# Patient Record
Sex: Male | Born: 1950 | Race: White | Hispanic: No | Marital: Single | Smoking: Former smoker
Health system: Southern US, Community
[De-identification: ages and names within clinical notes are randomized; demographics above are authoritative.]

## PROBLEM LIST (undated history)

## (undated) DIAGNOSIS — E785 Hyperlipidemia, unspecified: Secondary | ICD-10-CM

## (undated) DIAGNOSIS — I10 Essential (primary) hypertension: Secondary | ICD-10-CM

## (undated) HISTORY — DX: Hyperlipidemia, unspecified: E78.5

## (undated) HISTORY — PX: EYE SURGERY: SHX253

---

## 2017-10-30 ENCOUNTER — Ambulatory Visit (HOSPITAL_COMMUNITY)
Admission: EM | Admit: 2017-10-30 | Discharge: 2017-10-30 | Discharge status: Against Medical Advice | Payer: Medicare PPO | Source: Home / Self Care

## 2017-10-30 ENCOUNTER — Emergency Department (HOSPITAL_COMMUNITY): Payer: Medicare PPO

## 2017-10-30 ENCOUNTER — Encounter (HOSPITAL_COMMUNITY): Payer: Self-pay | Admitting: Emergency Medicine

## 2017-10-30 ENCOUNTER — Emergency Department (HOSPITAL_COMMUNITY)
Admission: EM | Admit: 2017-10-30 | Discharge: 2017-10-30 | Disposition: A | Discharge status: Home / Self Care | Payer: Medicare PPO | Attending: Emergency Medicine | Admitting: Emergency Medicine

## 2017-10-30 DIAGNOSIS — Y998 Other external cause status: Secondary | ICD-10-CM | POA: Diagnosis not present

## 2017-10-30 DIAGNOSIS — S060X0A Concussion without loss of consciousness, initial encounter: Secondary | ICD-10-CM | POA: Diagnosis not present

## 2017-10-30 DIAGNOSIS — Y9389 Activity, other specified: Secondary | ICD-10-CM | POA: Insufficient documentation

## 2017-10-30 DIAGNOSIS — R42 Dizziness and giddiness: Secondary | ICD-10-CM | POA: Diagnosis present

## 2017-10-30 DIAGNOSIS — I10 Essential (primary) hypertension: Secondary | ICD-10-CM | POA: Insufficient documentation

## 2017-10-30 DIAGNOSIS — Y9289 Other specified places as the place of occurrence of the external cause: Secondary | ICD-10-CM | POA: Diagnosis not present

## 2017-10-30 DIAGNOSIS — Z87891 Personal history of nicotine dependence: Secondary | ICD-10-CM | POA: Diagnosis not present

## 2017-10-30 DIAGNOSIS — W01198A Fall on same level from slipping, tripping and stumbling with subsequent striking against other object, initial encounter: Secondary | ICD-10-CM | POA: Insufficient documentation

## 2017-10-30 HISTORY — DX: Essential (primary) hypertension: I10

## 2017-10-30 LAB — COMPREHENSIVE METABOLIC PANEL
ALBUMIN: 3.3 g/dL — AB (ref 3.5–5.0)
ALT: 34 U/L (ref 17–63)
ANION GAP: 10 (ref 5–15)
AST: 39 U/L (ref 15–41)
Alkaline Phosphatase: 72 U/L (ref 38–126)
BUN: 9 mg/dL (ref 6–20)
CHLORIDE: 103 mmol/L (ref 101–111)
CO2: 25 mmol/L (ref 22–32)
Calcium: 9.2 mg/dL (ref 8.9–10.3)
Creatinine, Ser: 0.88 mg/dL (ref 0.61–1.24)
GFR calc non Af Amer: >60 mL/min (ref >60)
GLUCOSE: 106 mg/dL — AB (ref 65–99)
Potassium: 4.2 mmol/L (ref 3.5–5.1)
SODIUM: 138 mmol/L (ref 135–145)
Total Bilirubin: 0.6 mg/dL (ref 0.3–1.2)
Total Protein: 6.4 g/dL — ABNORMAL LOW (ref 6.5–8.1)

## 2017-10-30 LAB — I-STAT CHEM 8, ED
BUN: 11 mg/dL (ref 6–20)
CHLORIDE: 102 mmol/L (ref 101–111)
CREATININE: 0.9 mg/dL (ref 0.61–1.24)
Calcium, Ion: 1.25 mmol/L (ref 1.15–1.40)
Glucose, Bld: 100 mg/dL — ABNORMAL HIGH (ref 65–99)
HEMATOCRIT: 42 % (ref 39.0–52.0)
Hemoglobin: 14.3 g/dL (ref 13.0–17.0)
POTASSIUM: 4.2 mmol/L (ref 3.5–5.1)
Sodium: 139 mmol/L (ref 135–145)
TCO2: 25 mmol/L (ref 22–32)

## 2017-10-30 LAB — CBC
HCT: 42 % (ref 39.0–52.0)
Hemoglobin: 14 g/dL (ref 13.0–17.0)
MCH: 32.7 pg (ref 26.0–34.0)
MCHC: 33.3 g/dL (ref 30.0–36.0)
MCV: 98.1 fL (ref 78.0–100.0)
PLATELETS: 265 10*3/uL (ref 150–400)
RBC: 4.28 MIL/uL (ref 4.22–5.81)
RDW: 13.1 % (ref 11.5–15.5)
WBC: 6.3 10*3/uL (ref 4.0–10.5)

## 2017-10-30 LAB — DIFFERENTIAL
BASOS PCT: 0 %
Basophils Absolute: 0 10*3/uL (ref 0.0–0.1)
EOS PCT: 1 %
Eosinophils Absolute: 0.1 10*3/uL (ref 0.0–0.7)
Lymphocytes Relative: 23 %
Lymphs Abs: 1.5 10*3/uL (ref 0.7–4.0)
MONO ABS: 0.4 10*3/uL (ref 0.1–1.0)
Monocytes Relative: 7 %
NEUTROS PCT: 69 %
Neutro Abs: 4.4 10*3/uL (ref 1.7–7.7)

## 2017-10-30 MED ORDER — MECLIZINE HCL 25 MG PO TABS: 25.0000 mg | ORAL_TABLET | Freq: Three times a day (TID) | ORAL | 0 refills | Status: DC | PRN

## 2017-10-30 NOTE — ED Provider Notes (Signed)
[facility] EMERGENCY DEPARTMENT Provider Note   CSN: [id number] Arrival date & time: 10/30/17  1523     History   Chief Complaint Chief Complaint  Patient presents with  . Dizziness    HPI Mason Collins is a 67 y.o. male.  Presents evaluation with a chief complaint of dizziness and a recent fall.  HPI otherwise healthy 67 year old male with a history only of hypertension.  He moved himself in a moving [employer] from [place], to [place] this week.  He was working Wednesday about unloading his truck.  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  He states he was tired, had been working hard, not eaten much.  He states he had an episode where he felt a little confused Wednesday night.  Thursday he felt well.  He was hoping unload his truck.  He lost his balance and fell backward and struck his occiput on the ramp of his moving van.  He has a mild headache since that time.  He was concerned because he has had to need although improving dizziness.  He described this as the room spinning around him.  He had experienced a somewhat over the last few days before his fall and attributed to some nasal congestion and cold symptoms.   Past Medical History:  Diagnosis Date  . Hypertension     There are no active problems to display for this patient.   Past Surgical History:  Procedure Laterality Date  . EYE SURGERY         Home Medications    Prior to Admission medications   Medication Sig Start Date End Date Taking? Authorizing Provider  meclizine (ANTIVERT) 25 MG tablet Take 1 tablet (25 mg total) by mouth 3 (three) times daily as needed for dizziness. 10/30/17   Rolland Porter, MD    Family History History reviewed. No pertinent family history.  Social History Social History   Tobacco Use  . Smoking status: Former Games developer  . Smokeless tobacco: Never Used  Substance Use Topics  . Alcohol use: No    Frequency: Never  . Drug use: No     Allergies   Bactrim  [sulfamethoxazole-trimethoprim] and Penicillins   Review of Systems Review of Systems  Constitutional: Negative for appetite change, chills, diaphoresis, fatigue and fever.  HENT: Positive for congestion. Negative for mouth sores, sore throat and trouble swallowing.   Eyes: Negative for visual disturbance.  Respiratory: Negative for cough, chest tightness, shortness of breath and wheezing.   Cardiovascular: Negative for chest pain.  Gastrointestinal: Negative for abdominal distention, abdominal pain, diarrhea, nausea and vomiting.  Endocrine: Negative for polydipsia, polyphagia and polyuria.  Genitourinary: Negative for dysuria, frequency and hematuria.  Musculoskeletal: Negative for gait problem.  Skin: Negative for color change, pallor and rash.  Neurological: Positive for dizziness. Negative for syncope, light-headedness and headaches.  Hematological: Does not bruise/bleed easily.  Psychiatric/Behavioral: Negative for behavioral problems and confusion.     Physical Exam Updated Vital Signs BP (!) 153/89   Pulse 61   Temp 98.9 F (37.2 C) (Oral)   Resp 14   Ht 5\' 10"  (1.778 m)   Wt 77.1 kg (170 lb)   SpO2 100%   BMI 24.39 kg/m   Physical Exam  Constitutional: He is oriented to person, place, and time. He appears well-developed and well-nourished. No distress.  HENT:  Head: Normocephalic.  No sign of trauma to the head.  No contusion or tenderness over the scalp.  Nontender the midline neck  and spine.  Eyes: Conjunctivae are normal. Pupils are equal, round, and reactive to light. No scleral icterus.  Neck: Normal range of motion. Neck supple. No thyromegaly present.  Cardiovascular: Normal rate and regular rhythm. Exam reveals no gallop and no friction rub.  No murmur heard. Pulmonary/Chest: Effort normal and breath sounds normal. No respiratory distress. He has no wheezes. He has no rales.  Abdominal: Soft. Bowel sounds are normal. He exhibits no distension. There is no  tenderness. There is no rebound.  Musculoskeletal: Normal range of motion.  Neurological: He is alert and oriented to person, place, and time.  Normal cranial nerve exam.  He has minimal nystagmus to lateral gaze.  Otherwise intact to symmetric cranial nerves gait and peripheral sensation and strength.  Skin: Skin is warm and dry. No rash noted.  Psychiatric: He has a normal mood and affect. His behavior is normal.     ED Treatments / Results  Labs (all labs ordered are listed, but only abnormal results are displayed) Labs Reviewed  COMPREHENSIVE METABOLIC PANEL - Abnormal; Notable for the following components:      Result Value   Glucose, Bld 106 (*)    Total Protein 6.4 (*)    Albumin 3.3 (*)    All other components within normal limits  I-STAT CHEM 8, ED - Abnormal; Notable for the following components:   Glucose, Bld 100 (*)    All other components within normal limits  CBC  DIFFERENTIAL    EKG  EKG Interpretation None       Radiology Ct Head Wo Contrast  Result Date: 10/30/2017 CLINICAL DATA:  Status post fall 3 days ago. Hit head against asphalt fall. No loss of consciousness. Dizziness with headaches. EXAM: CT HEAD WITHOUT CONTRAST TECHNIQUE: Contiguous axial images were obtained from the base of the skull through the vertex without intravenous contrast. COMPARISON:  None. FINDINGS: Brain: No evidence of acute infarction, hemorrhage, extra-axial collection, ventriculomegaly, or mass effect. Generalized cerebral atrophy. Periventricular white matter low attenuation likely secondary to microangiopathy. Vascular: Cerebrovascular atherosclerotic calcifications are noted. Skull: Negative for fracture or focal lesion. Sinuses/Orbits: Visualized portions of the orbits are unremarkable. Visualized portions of the paranasal sinuses and mastoid air cells are unremarkable. Other: None. IMPRESSION: 1. No acute intracranial pathology. 2. Chronic microvascular disease and cerebral  atrophy. Electronically Signed   By: Elige KoHetal  Patel   On: 10/30/2017 16:30    Procedures Procedures (including critical care time)  Medications Ordered in ED Medications - No data to display   Initial Impression / Assessment and Plan / ED Course  I have reviewed the triage vital signs and the nursing notes.  Pertinent labs & imaging results that were available during my care of the patient were reviewed by me and considered in my medical decision making (see chart for details).    Vertigo.  Perhaps combination of acute peripheral versus concussive.  No additional close head injury symptoms.  I think is appropriate for outpatient treatment.  PRN meclizine.  Driving precautions.  Recheck any worsening.  Final Clinical Impressions(s) / ED Diagnoses   Final diagnoses:  Concussion without loss of consciousness, initial encounter    ED Discharge Orders        Ordered    meclizine (ANTIVERT) 25 MG tablet  3 times daily PRN     10/30/17 1854       Rolland PorterJames, Lauree Yurick, MD 10/30/17 2356

## 2017-10-30 NOTE — ED Notes (Signed)
Per pt, was unloading truck when he fell backwards on ramp, falling and hit head on asphalt 2 days ago.  No LOC.  Has had some dizziness, and has to "get my bearings when I stand up".  No blood thinner use.  Family member states that he moved here from [place] 3 days ago; prior to fall, he had "an episode" of driving, but having no recollection of how he got to where he was, and no recollection of family member picking him up.  Discussed with Mannie StabileM. Clark, PA and Arthor CaptainA. Yu, PA - both concur pt needs to be evaluated in ED for CT scan.  Pt & family member verbalized understanding.

## 2017-10-30 NOTE — ED Triage Notes (Addendum)
Patient presents to ED for assessment after a fall on Thursday.  Patient states he has been dealing with dizziness "for a bit", but since the fall, he struck the back left of his head, and he has been having worsening dizziness.  Patient denies chest pain, SOB.  States he went on a long car trip for 2 days about a week ago.  Patient c/o a head fullness recently with nasal congestion.  No neuro deficits noted in triage.  Patient states he also cannot remember the happenings of Wednesday night, and is concerned about the memory lapse.

## 2017-10-30 NOTE — ED Notes (Signed)
Pt and wife dressed standing in doorway of room

## 2017-10-30 NOTE — ED Notes (Signed)
Pt stable, ambulatory, states understanding of discharge instructions 

## 2017-10-30 NOTE — Discharge Instructions (Signed)
Meclizine as needed for dizziness

## 2017-11-09 ENCOUNTER — Telehealth: Payer: Self-pay | Admitting: Family Medicine

## 2017-11-09 NOTE — Telephone Encounter (Signed)
Copied from CRM [id number]. Topic: Appointment Scheduling - New Patient >> Nov 09, 2017  2:46 PM Waymon AmatoBurton, Donna F wrote: Pt ex wife is wanitng to get pt established with Dr. Salomon FickBanks - he is new in town and and while moving he hit his head and has a concussion 2 weeks ago   Best number [phone] ex wife

## 2017-11-16 NOTE — Telephone Encounter (Signed)
Is pt unable to call himself about scheduling an appt?

## 2017-11-16 NOTE — Telephone Encounter (Signed)
Called and spoke with wife. She verbalized that she will have him call the office and inquire if we can accept him as a  New pt.

## 2018-06-15 ENCOUNTER — Encounter: Payer: Self-pay | Admitting: Family Medicine

## 2018-06-15 ENCOUNTER — Ambulatory Visit: Payer: Medicare PPO | Admitting: Family Medicine

## 2018-06-15 VITALS — BP 128/82 | HR 100 | Temp 97.9°F | Ht 70.0 in | Wt 186.0 lb

## 2018-06-15 DIAGNOSIS — Z23 Encounter for immunization: Secondary | ICD-10-CM | POA: Diagnosis not present

## 2018-06-15 DIAGNOSIS — E785 Hyperlipidemia, unspecified: Secondary | ICD-10-CM | POA: Diagnosis not present

## 2018-06-15 DIAGNOSIS — R42 Dizziness and giddiness: Secondary | ICD-10-CM | POA: Diagnosis not present

## 2018-06-15 DIAGNOSIS — J302 Other seasonal allergic rhinitis: Secondary | ICD-10-CM

## 2018-06-15 DIAGNOSIS — I10 Essential (primary) hypertension: Secondary | ICD-10-CM

## 2018-06-15 DIAGNOSIS — Z7689 Persons encountering health services in other specified circumstances: Secondary | ICD-10-CM

## 2018-06-15 DIAGNOSIS — K219 Gastro-esophageal reflux disease without esophagitis: Secondary | ICD-10-CM | POA: Diagnosis not present

## 2018-06-15 MED ORDER — MECLIZINE HCL 25 MG PO TABS: 25.0000 mg | ORAL_TABLET | Freq: Three times a day (TID) | ORAL | 3 refills | Status: DC | PRN

## 2018-06-15 MED ORDER — LISINOPRIL 10 MG PO TABS: 10.0000 mg | ORAL_TABLET | Freq: Every day | ORAL | 3 refills | Status: AC

## 2018-06-15 MED ORDER — ESOMEPRAZOLE MAGNESIUM 40 MG PO CPDR: 40.0000 mg | DELAYED_RELEASE_CAPSULE | Freq: Every day | ORAL | 3 refills | Status: AC

## 2018-06-15 MED ORDER — FLUTICASONE PROPIONATE 50 MCG/ACT NA SUSP: 1.0000 | Freq: Every day | NASAL | 3 refills | Status: AC

## 2018-06-15 MED ORDER — ATORVASTATIN CALCIUM 20 MG PO TABS: 20.0000 mg | ORAL_TABLET | Freq: Every day | ORAL | 3 refills | Status: AC

## 2018-06-15 NOTE — Patient Instructions (Signed)
Food Choices for Gastroesophageal Reflux Disease, Adult When you have gastroesophageal reflux disease (GERD), the foods you eat and your eating habits are very important. Choosing the right foods can help ease the discomfort of GERD. Consider working with a diet and nutrition specialist (dietitian) to help you make healthy food choices. What general guidelines should I follow? Eating plan  Choose healthy foods low in fat, such as fruits, vegetables, whole grains, low-fat dairy products, and lean meat, fish, and poultry.  Eat frequent, small meals instead of three large meals each day. Eat your meals slowly, in a relaxed setting. Avoid bending over or lying down until 2-3 hours after eating.  Limit high-fat foods such as fatty meats or fried foods.  Limit your intake of oils, butter, and shortening to less than 8 teaspoons each day.  Avoid the following: ? Foods that cause symptoms. These may be different for different people. Keep a food diary to keep track of foods that cause symptoms. ? Alcohol. ? Drinking large amounts of liquid with meals. ? Eating meals during the 2-3 hours before bed.  Cook foods using methods other than frying. This may include baking, grilling, or broiling. Lifestyle   Maintain a healthy weight. Ask your health care provider what weight is healthy for you. If you need to lose weight, work with your health care provider to do so safely.  Exercise for at least 30 minutes on 5 or more days each week, or as told by your health care provider.  Avoid wearing clothes that fit tightly around your waist and chest.  Do not use any products that contain nicotine or tobacco, such as cigarettes and e-cigarettes. If you need help quitting, ask your health care provider.  Sleep with the head of your bed raised. Use a wedge under the mattress or blocks under the bed frame to raise the head of the bed. What foods are not recommended? The items listed may not be a complete  list. Talk with your dietitian about what dietary choices are best for you. Grains Pastries or quick breads with added fat. French toast. Vegetables Deep fried vegetables. French fries. Any vegetables prepared with added fat. Any vegetables that cause symptoms. For some people this may include tomatoes and tomato products, chili peppers, onions and garlic, and horseradish. Fruits Any fruits prepared with added fat. Any fruits that cause symptoms. For some people this may include citrus fruits, such as oranges, grapefruit, pineapple, and lemons. Meats and other protein foods High-fat meats, such as fatty beef or pork, hot dogs, ribs, ham, sausage, salami and bacon. Fried meat or protein, including fried fish and fried chicken. Nuts and nut butters. Dairy Whole milk and chocolate milk. Sour cream. Cream. Ice cream. Cream cheese. Milk shakes. Beverages Coffee and tea, with or without caffeine. Carbonated beverages. Sodas. Energy drinks. Fruit juice made with acidic fruits (such as [place] or grapefruit). Tomato juice. Alcoholic drinks. Fats and oils Butter. Margarine. Shortening. Ghee. Sweets and desserts Chocolate and cocoa. Donuts. Seasoning and other foods Pepper. Peppermint and spearmint. Any condiments, herbs, or seasonings that cause symptoms. For some people, this may include curry, hot sauce, or vinegar-based salad dressings. Summary  When you have gastroesophageal reflux disease (GERD), food and lifestyle choices are very important to help ease the discomfort of GERD.  Eat frequent, small meals instead of three large meals each day. Eat your meals slowly, in a relaxed setting. Avoid bending over or lying down until 2-3 hours after eating.  Limit high-fat   foods such as fatty meat or fried foods. This information is not intended to replace advice given to you by your health care provider. Make sure you discuss any questions you have with your health care provider. Document Released:  08/16/2005 Document Revised: 08/17/2016 Document Reviewed: 08/17/2016 Elsevier Interactive Patient Education  2018 [employer]  High Cholesterol High cholesterol is a condition in which the blood has high levels of a white, waxy, fat-like substance (cholesterol). The human body needs small amounts of cholesterol. The liver makes all the cholesterol that the body needs. Extra (excess) cholesterol comes from the food that we eat. Cholesterol is carried from the liver by the blood through the blood vessels. If you have high cholesterol, deposits (plaques) may build up on the walls of your blood vessels (arteries). Plaques make the arteries narrower and stiffer. Cholesterol plaques increase your risk for heart attack and stroke. Work with your health care provider to keep your cholesterol levels in a healthy range. What increases the risk? This condition is more likely to develop in people who:  Eat foods that are high in animal fat (saturated fat) or cholesterol.  Are overweight.  Are not getting enough exercise.  Have a family history of high cholesterol.  What are the signs or symptoms? There are no symptoms of this condition. How is this diagnosed? This condition may be diagnosed from the results of a blood test.  If you are older than age 91, your health care provider may check your cholesterol every 4-6 years.  You may be checked more often if you already have high cholesterol or other risk factors for heart disease.  The blood test for cholesterol measures:  "Bad" cholesterol (LDL cholesterol). This is the main type of cholesterol that causes heart disease. The desired level for LDL is less than 100.  "Good" cholesterol (HDL cholesterol). This type helps to protect against heart disease by cleaning the arteries and carrying the LDL away. The desired level for HDL is 60 or higher.  Triglycerides. These are fats that the body can store or burn for energy. The desired number for  triglycerides is lower than 150.  Total cholesterol. This is a measure of the total amount of cholesterol in your blood, including LDL cholesterol, HDL cholesterol, and triglycerides. A healthy number is less than 200.  How is this treated? This condition is treated with diet changes, lifestyle changes, and medicines. Diet changes  This may include eating more whole grains, fruits, vegetables, nuts, and fish.  This may also include cutting back on red meat and foods that have a lot of added sugar. Lifestyle changes  Changes may include getting at least 40 minutes of aerobic exercise 3 times a week. Aerobic exercises include walking, biking, and swimming. Aerobic exercise along with a healthy diet can help you maintain a healthy weight.  Changes may also include quitting smoking. Medicines  Medicines are usually given if diet and lifestyle changes have failed to reduce your cholesterol to healthy levels.  Your health care provider may prescribe a statin medicine. Statin medicines have been shown to reduce cholesterol, which can reduce the risk of heart disease. Follow these instructions at home: Eating and drinking  If told by your health care provider:  Eat chicken (without skin), fish, veal, shellfish, ground [place] breast, and round or loin cuts of red meat.  Do not eat fried foods or fatty meats, such as hot dogs and salami.  Eat plenty of fruits, such as apples.  Eat  plenty of vegetables, such as broccoli, potatoes, and carrots.  Eat beans, peas, and lentils.  Eat grains such as barley, rice, couscous, and bulgur wheat.  Eat pasta without cream sauces.  Use skim or nonfat milk, and eat low-fat or nonfat yogurt and cheeses.  Do not eat or drink whole milk, cream, ice cream, egg yolks, or hard cheeses.  Do not eat stick margarine or tub margarines that contain trans fats (also called partially hydrogenated oils).  Do not eat saturated tropical oils, such as coconut oil  and palm oil.  Do not eat cakes, cookies, crackers, or other baked goods that contain trans fats.  General instructions  Exercise as directed by your health care provider. Increase your activity level with activities such as gardening, walking, and taking the stairs.  Take over-the-counter and prescription medicines only as told by your health care provider.  Do not use any products that contain nicotine or tobacco, such as cigarettes and e-cigarettes. If you need help quitting, ask your health care provider.  Keep all follow-up visits as told by your health care provider. This is important. Contact a health care provider if:  You are struggling to maintain a healthy diet or weight.  You need help to start on an exercise program.  You need help to stop smoking. Get help right away if:  You have chest pain.  You have trouble breathing. This information is not intended to replace advice given to you by your health care provider. Make sure you discuss any questions you have with your health care provider. Document Released: 08/16/2005 Document Revised: 03/13/2016 Document Reviewed: 02/14/2016 Elsevier Interactive Patient Education  2018 [employer].  How to Take Your Blood Pressure You can take your blood pressure at home with a machine. You may need to check your blood pressure at home:  To check if you have high blood pressure (hypertension).  To check your blood pressure over time.  To make sure your blood pressure medicine is working.  Supplies needed: You will need a blood pressure machine, or monitor. You can buy one at a drugstore or online. When choosing one:  Choose one with an arm cuff.  Choose one that wraps around your upper arm. Only one finger should fit between your arm and the cuff.  Do not choose one that measures your blood pressure from your wrist or finger.  Your doctor can suggest a monitor. How to prepare Avoid these things for 30 minutes before  checking your blood pressure:  Drinking caffeine.  Drinking alcohol.  Eating.  Smoking.  Exercising.  Five minutes before checking your blood pressure:  Pee.  Sit in a dining chair. Avoid sitting in a soft couch or armchair.  Be quiet. Do not talk.  How to take your blood pressure Follow the instructions that came with your machine. If you have a digital blood pressure monitor, these may be the instructions: 1. Sit up straight. 2. Place your feet on the floor. Do not cross your ankles or legs. 3. Rest your left arm at the level of your heart. You may rest it on a table, desk, or chair. 4. Pull up your shirt sleeve. 5. Wrap the blood pressure cuff around the upper part of your left arm. The cuff should be 1 inch (2.5 cm) above your elbow. It is best to wrap the cuff around bare skin. 6. Fit the cuff snugly around your arm. You should be able to place only one finger between the cuff  and your arm. 7. Put the cord inside the groove of your elbow. 8. Press the power button. 9. Sit quietly while the cuff fills with air and loses air. 10. Write down the numbers on the screen. 11. Wait 2-3 minutes and then repeat steps 1-10.  What do the numbers mean? Two numbers make up your blood pressure. The first number is called systolic pressure. The second is called diastolic pressure. An example of a blood pressure reading is "120 over 80" (or 120/80). If you are an adult and do not have a medical condition, use this guide to find out if your blood pressure is normal: Normal  First number: below 120.  Second number: below 80. Elevated  First number: 120-129.  Second number: below 80. Hypertension stage 1  First number: 130-139.  Second number: 80-89. Hypertension stage 2  First number: 140 or above.  Second number: 90 or above. Your blood pressure is above normal even if only the top or bottom number is above normal. Follow these instructions at home:  Check your blood  pressure as often as your doctor tells you to.  Take your monitor to your next doctor's appointment. Your doctor will: ? Make sure you are using it correctly. ? Make sure it is working right.  Make sure you understand what your blood pressure numbers should be.  Tell your doctor if your medicines are causing side effects. Contact a doctor if:  Your blood pressure keeps being high. Get help right away if:  Your first blood pressure number is higher than 180.  Your second blood pressure number is higher than 120. This information is not intended to replace advice given to you by your health care provider. Make sure you discuss any questions you have with your health care provider. Document Released: 07/29/2008 Document Revised: 07/14/2016 Document Reviewed: 01/23/2016 Elsevier Interactive Patient Education  2018 [employer]. Vertigo Vertigo is the feeling that you or your surroundings are moving when they are not. Vertigo can be dangerous if it occurs while you are doing something that could endanger you or others, such as driving. What are the causes? This condition is caused by a disturbance in the signals that are sent by your body's sensory systems to your brain. Different causes of a disturbance can lead to vertigo, including:  Infections, especially in the inner ear.  A bad reaction to a drug, or misuse of alcohol and medicines.  Withdrawal from drugs or alcohol.  Quickly changing positions, as when lying down or rolling over in bed.  Migraine headaches.  Decreased blood flow to the brain.  Decreased blood pressure.  Increased pressure in the brain from a head or neck injury, stroke, infection, tumor, or bleeding.  Central nervous system disorders.  What are the signs or symptoms? Symptoms of this condition usually occur when you move your head or your eyes in different directions. Symptoms may start suddenly, and they usually last for less than a minute. Symptoms  may include:  Loss of balance and falling.  Feeling like you are spinning or moving.  Feeling like your surroundings are spinning or moving.  Nausea and vomiting.  Blurred vision or double vision.  Difficulty hearing.  Slurred speech.  Dizziness.  Involuntary eye movement (nystagmus).  Symptoms can be mild and cause only slight annoyance, or they can be severe and interfere with daily life. Episodes of vertigo may return (recur) over time, and they are often triggered by certain movements. Symptoms may improve over time.  How is this diagnosed? This condition may be diagnosed based on medical history and the quality of your nystagmus. Your health care provider may test your eye movements by asking you to quickly change positions to trigger the nystagmus. This may be called the Dix-Hallpike test, head thrust test, or roll test. You may be referred to a health care provider who specializes in ear, nose, and throat (ENT) problems (otolaryngologist) or a provider who specializes in disorders of the central nervous system (neurologist). You may have additional testing, including:  A physical exam.  Blood tests.  MRI.  A CT scan.  An electrocardiogram (ECG). This records electrical activity in your heart.  An electroencephalogram (EEG). This records electrical activity in your brain.  Hearing tests.  How is this treated? Treatment for this condition depends on the cause and the severity of the symptoms. Treatment options include:  Medicines to treat nausea or vertigo. These are usually used for severe cases. Some medicines that are used to treat other conditions may also reduce or eliminate vertigo symptoms. These include: ? Medicines that control allergies (antihistamines). ? Medicines that control seizures (anticonvulsants). ? Medicines that relieve depression (antidepressants). ? Medicines that relieve anxiety (sedatives).  Head movements to adjust your inner ear back to  normal. If your vertigo is caused by an ear problem, your health care provider may recommend certain movements to correct the problem.  Surgery. This is rare.  Follow these instructions at home: Safety  Move slowly.Avoid sudden body or head movements.  Avoid driving.  Avoid operating heavy machinery.  Avoid doing any tasks that would cause danger to you or others if you would have a vertigo episode during the task.  If you have trouble walking or keeping your balance, try using a cane for stability. If you feel dizzy or unstable, sit down right away.  Return to your normal activities as told by your health care provider. Ask your health care provider what activities are safe for you. General instructions  Take over-the-counter and prescription medicines only as told by your health care provider.  Avoid certain positions or movements as told by your health care provider.  Drink enough fluid to keep your urine clear or pale yellow.  Keep all follow-up visits as told by your health care provider. This is important. Contact a health care provider if:  Your medicines do not relieve your vertigo or they make it worse.  You have a fever.  Your condition gets worse or you develop new symptoms.  Your family or friends notice any behavioral changes.  Your nausea or vomiting gets worse.  You have numbness or a "pins and needles" sensation in part of your body. Get help right away if:  You have difficulty moving or speaking.  You are always dizzy.  You faint.  You develop severe headaches.  You have weakness in your hands, arms, or legs.  You have changes in your hearing or vision.  You develop a stiff neck.  You develop sensitivity to light. This information is not intended to replace advice given to you by your health care provider. Make sure you discuss any questions you have with your health care provider. Document Released: 05/26/2005 Document Revised: 01/28/2016  Document Reviewed: 12/09/2014 Elsevier Interactive Patient Education  [employer].

## 2018-06-15 NOTE — Addendum Note (Signed)
Addended by: Carola Rhine on: 06/15/2018 03:01 PM   Modules accepted: Orders

## 2018-06-15 NOTE — Progress Notes (Signed)
Patient presents to clinic today to establish care.  SUBJECTIVE: PMH:  Pt is a 67 yo male with pmg sig HTN, HLD, h/o skin cancer, seasonal allergies.  Pt was previously seen in [place], [place].  HTN: -taking lisinopril 10 mg daily -has bp cuff at home, but not using -tries to eat healthy  HLD: -taking atorvastatin 20 mg nightly -Denies myalgias  Vertigo: -Patient endorses symptoms of dizziness s/p concussion after a fall off a ramp while moving. -Also notes a second episode after falling at work off a stool. -Patient occasionally has done dizziness after sitting up in bed been standing. -Takes meclizine 25 mg as needed  GERD: -Patient notes symptoms with acidic foods -Takes as esomeprazole 40 mg daily  Seasonal allergies: -Taking Nasacort times years -States occasionally feels like the medicine does not work as well.  Social history: Pt is divorced.  Pt moved to the area to be closer to his daughter and grandchild.  His ex-wife, Earnie Bechard, also moved to the area and is a pt of this provider.  Pt is retired from working part-time at [employer].  Pt endorses social alcohol use.  Pt denies tobacco and drug use.  Health Maintenance: Immunizations --Pneumovax 2017, influenza vaccine 2018, shingles vaccine 2017. Colonoscopy --2010  Past Medical History:  Diagnosis Date  . Hyperlipidemia   . Hypertension     Past Surgical History:  Procedure Laterality Date  . EYE SURGERY      Current Outpatient Medications on File Prior to Visit  Medication Sig Dispense Refill  . Multiple Vitamin (MULTIVITAMINS PO) Take by mouth daily.     No current facility-administered medications on file prior to visit.     Allergies  Allergen Reactions  . Bactrim [Sulfamethoxazole-Trimethoprim] Rash  . Penicillins Rash    History reviewed. No pertinent family history.  Social History   Socioeconomic History  . Marital status: Single    Spouse name: Not on file  . Number of children:  Not on file  . Years of education: Not on file  . Highest education level: Not on file  Occupational History  . Not on file  Social Needs  . Financial resource strain: Not on file  . Food insecurity:    Worry: Not on file    Inability: Not on file  . Transportation needs:    Medical: Not on file    Non-medical: Not on file  Tobacco Use  . Smoking status: Former Games developer  . Smokeless tobacco: Never Used  Substance and Sexual Activity  . Alcohol use: No    Frequency: Never  . Drug use: No  . Sexual activity: Not on file  Lifestyle  . Physical activity:    Days per week: Not on file    Minutes per session: Not on file  . Stress: Not on file  Relationships  . Social connections:    Talks on phone: Not on file    Gets together: Not on file    Attends religious service: Not on file    Active member of club or organization: Not on file    Attends meetings of clubs or organizations: Not on file    Relationship status: Not on file  . Intimate partner violence:    Fear of current or ex partner: Not on file    Emotionally abused: Not on file    Physically abused: Not on file    Forced sexual activity: Not on file  Other Topics Concern  . Not on  file  Social History Narrative  . Not on file    ROS General: Denies fever, chills, night sweats, changes in weight, changes in appetite  +reccurent dizziness HEENT: Denies headaches, ear pain, changes in vision, rhinorrhea, sore throat CV: Denies CP, palpitations, SOB, orthopnea Pulm: Denies SOB, cough, wheezing GI: Denies abdominal pain, nausea, vomiting, diarrhea, constipation GU: Denies dysuria, hematuria, frequency, vaginal discharge Msk: Denies muscle cramps, joint pains Neuro: Denies weakness, numbness, tingling Skin: Denies rashes, bruising Psych: Denies depression, anxiety, hallucinations  BP 128/82 (BP Location: Left Arm, Patient Position: Sitting, Cuff Size: Normal)   Pulse 100   Temp 97.9 F (36.6 C) (Oral)   Ht 5'  10" (1.778 m)   Wt 186 lb (84.4 kg)   SpO2 96%   BMI 26.69 kg/m   Physical Exam Gen. Pleasant, well developed, well-nourished, in NAD HEENT - [place]/AT, PERRL, no scleral icterus, no nasal drainage,  Lungs: no use of accessory muscles, CTAB, no wheezes, rales or rhonchi Cardiovascular: RRR, No r/g/m, no peripheral edema Abdomen: BS present, soft, nontender, nondistended Neuro:  A&Ox3, CN II-XII intact, normal gait Skin:  Warm, dry, intact, no lesions.  Large well-healed diagonal scar on mid right medial back.  Well-healed surgical incision of right lower eyelid.  No results found for this or any previous visit (from the past 2160 hour(s)).  Assessment/Plan: Essential hypertension  -Controlled -Continue lifestyle modifications -Check BP at home - Plan: lisinopril (PRINIVIL,ZESTRIL) 10 MG tablet  Hyperlipidemia, unspecified hyperlipidemia type  - Plan: atorvastatin (LIPITOR) 20 MG tablet  Vertigo  -Given handout - Plan: meclizine (ANTIVERT) 25 MG tablet  Gastroesophageal reflux disease, esophagitis presence not specified  -Given a list of foods known to cause GERD symptoms. - Plan: esomeprazole (NEXIUM) 40 MG capsule  Seasonal allergies  - Plan: fluticasone (FLONASE) 50 MCG/ACT nasal spray  Need for immunization against influenza -Influenza vaccine given this visit  Encounter to establish care -We reviewed the PMH, PSH, FH, SH, Meds and Allergies. -We provided refills for any medications we will prescribe as needed. -We addressed current concerns per orders and patient instructions. -We have asked for records for pertinent exams, studies, vaccines and notes from previous providers. -We have advised patient to follow up per instructions below.  F/u prn for CPE  Abbe Amsterdam, MD

## 2018-10-16 ENCOUNTER — Other Ambulatory Visit: Payer: Self-pay | Admitting: Family Medicine

## 2018-10-16 DIAGNOSIS — R42 Dizziness and giddiness: Secondary | ICD-10-CM

## 2018-10-16 NOTE — Telephone Encounter (Signed)
Last rx given for #60 with 3 ref on 10/17

## 2019-01-07 ENCOUNTER — Telehealth: Payer: Self-pay | Admitting: Family Medicine

## 2019-01-07 NOTE — Telephone Encounter (Signed)
Received a call from the answering service on call that pt was found deceased in his home  I did not get any more details than that- the nurse line did not keep EMS on the line  Cc to PCP

## 2019-01-10 ENCOUNTER — Telehealth: Payer: Self-pay | Admitting: Family Medicine

## 2019-01-10 NOTE — Telephone Encounter (Signed)
Copied from CRM [id number]. Topic: Quick Communication - See Telephone Encounter >> Jan 10, 2019  9:17 AM Angela Nevin wrote: CRM for notification. See Telephone encounter for: 01/10/19.   Ron, with [facility] and [employer], states he will bring patient's death certificate by office today for provider.

## 2019-01-10 NOTE — Telephone Encounter (Signed)
Ok

## 2019-01-11 NOTE — Telephone Encounter (Signed)
Patients Death Certificate has been completed by Dr Salomon Fick, [facility] and Katina Degree home was notified to pick up Certificate from the office.

## 2019-01-29 DEATH — deceased

## 2019-06-21 IMAGING — CT CT HEAD W/O CM
4 series · 16 of 47 positions shown, 18 images · non-contrast
Comparison: None.

CLINICAL DATA: Status post fall 3 days ago. Hit head against
asphalt fall. No loss of consciousness. Dizziness with headaches.

EXAM:
CT HEAD WITHOUT CONTRAST
TECHNIQUE: Contiguous axial images were obtained from the base of the skull
through the vertex without intravenous contrast.

[Series 3: head wo · axial · 0.46mm/px · z∈[-107,+13]mm · 7 of 32 slices shown, 9 images]
[im 4/32  brain]
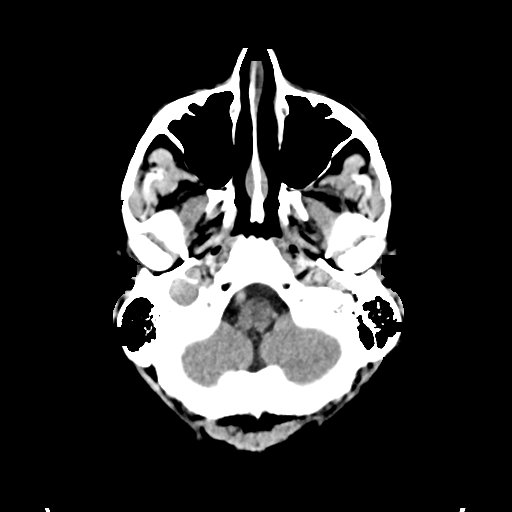
[im 4/32  bone]
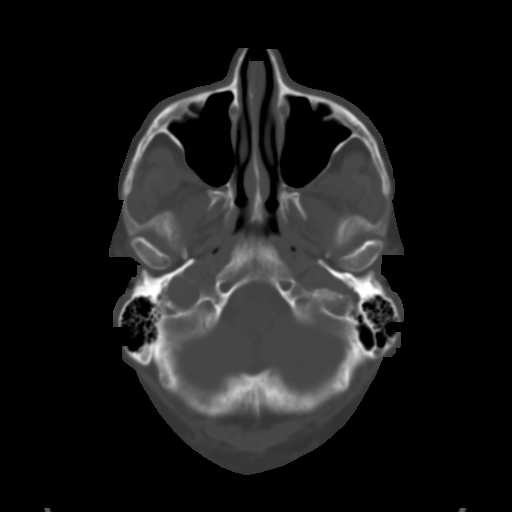
[im 8/32  brain]
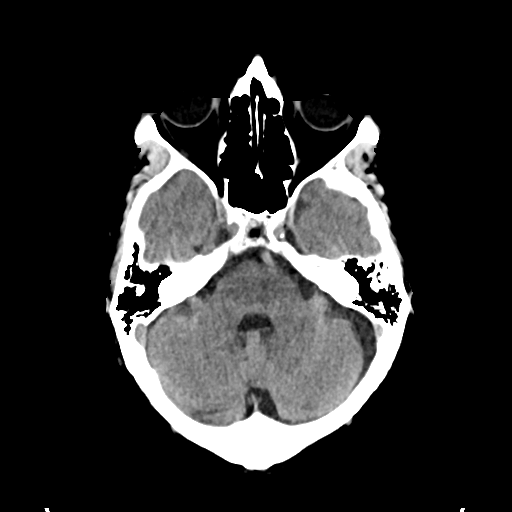
[im 12/32  brain]
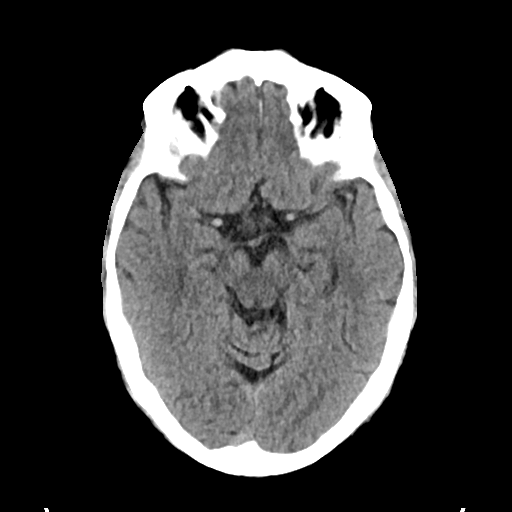
[im 16/32  brain]
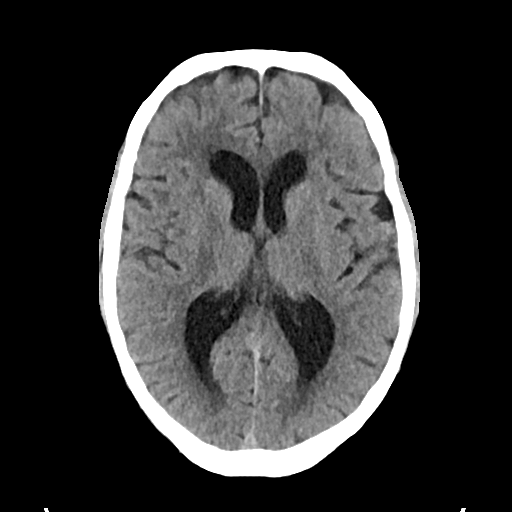
[im 20/32  brain]
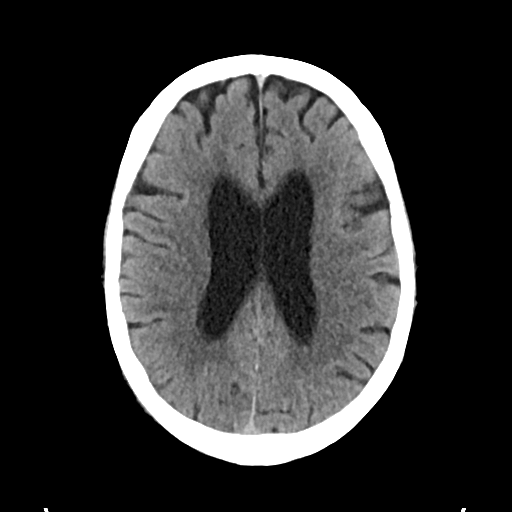
[im 20/32  bone]
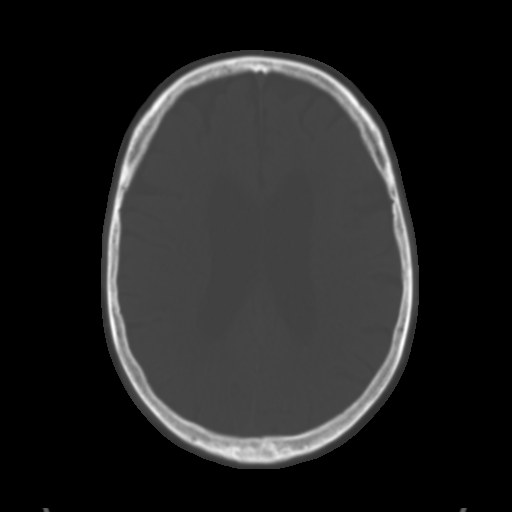
[im 24/32  brain]
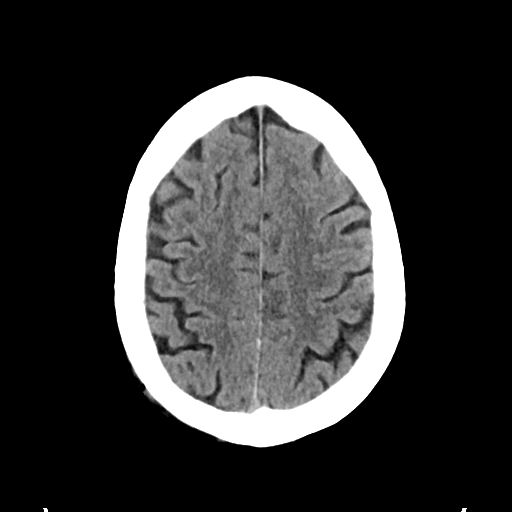
[im 28/32  brain]
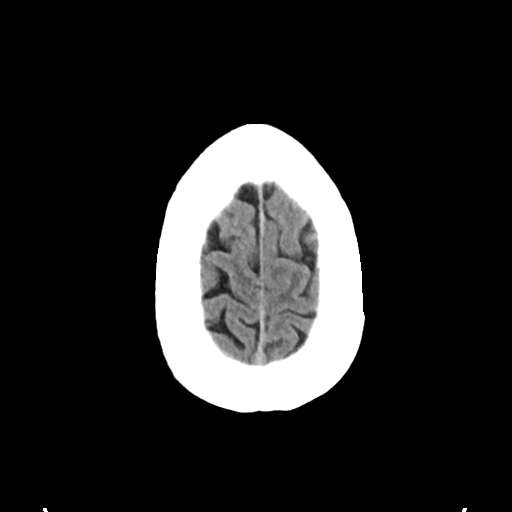

[Series 4: head bone · axial · 0.46mm/px · z∈[-108,-76]mm · 3 of 80 slices shown]
[im 8/80  bone]
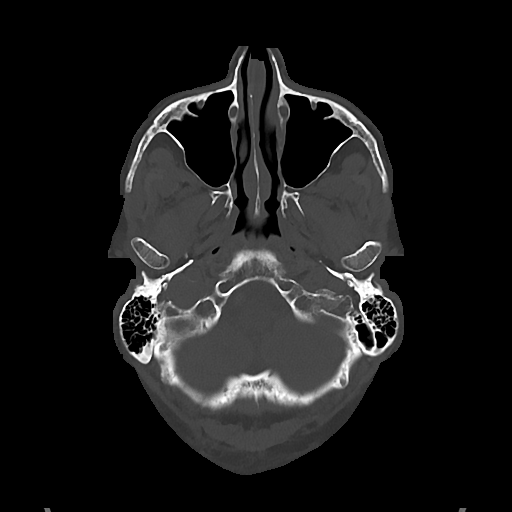
[im 16/80  bone]
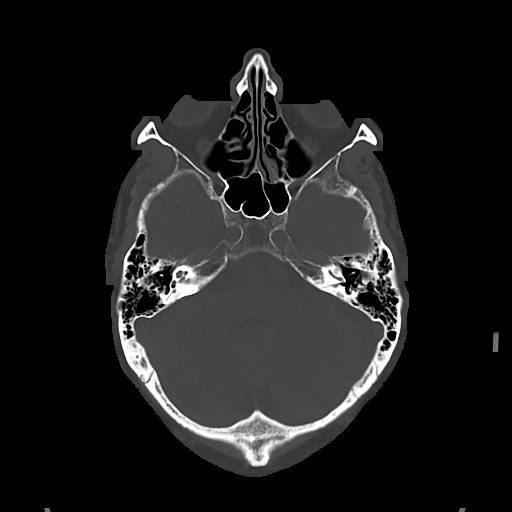
[im 24/80  bone]
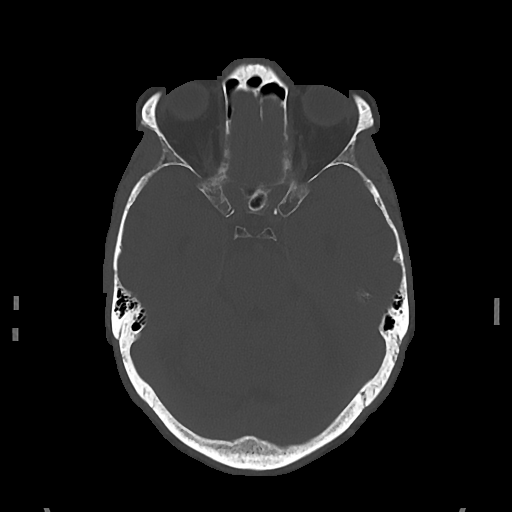

[Series 5: cor soft · coronal · 0.34mm/px · 3 of 68 slices shown]
[im 23/68  brain]
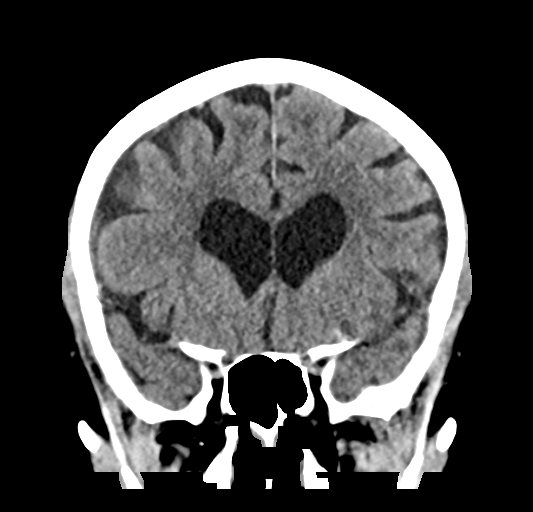
[im 30/68  brain]
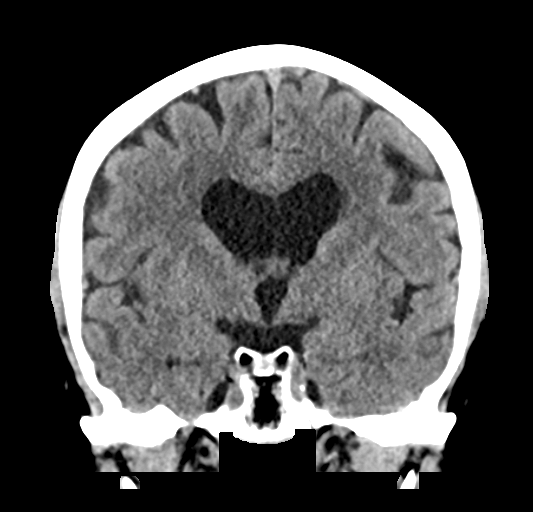
[im 38/68  brain]
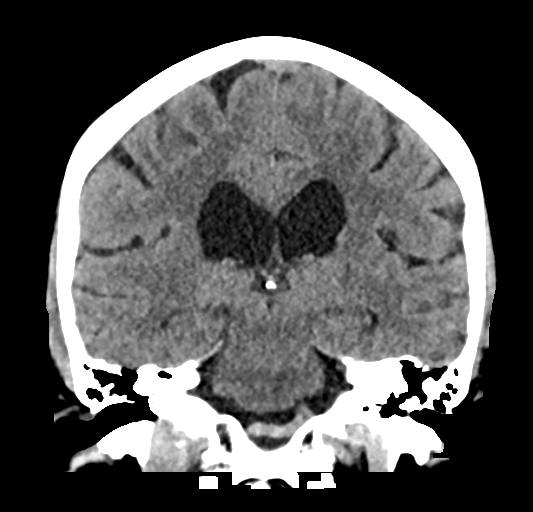

[Series 6: sag soft · sagittal · 0.33mm/px · 3 of 53 slices shown]
[im 18/53  brain]
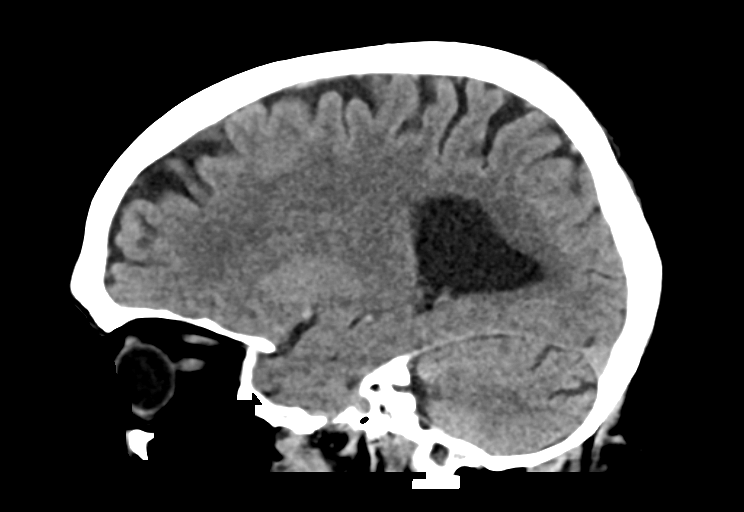
[im 27/53  brain]
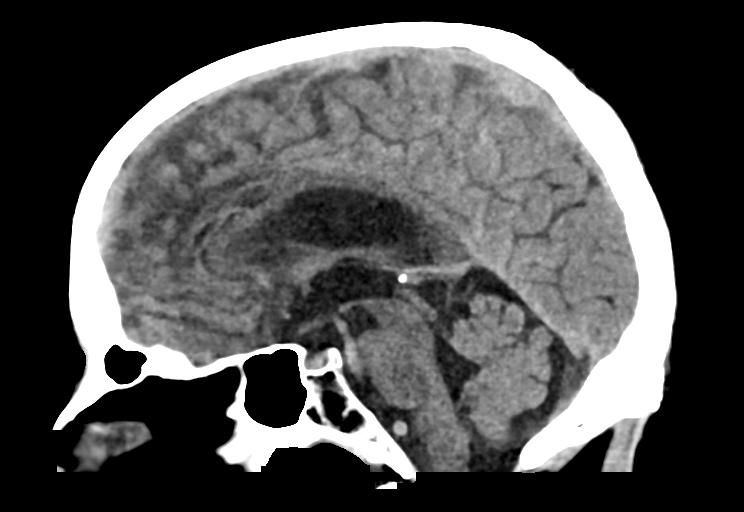
[im 35/53  brain]
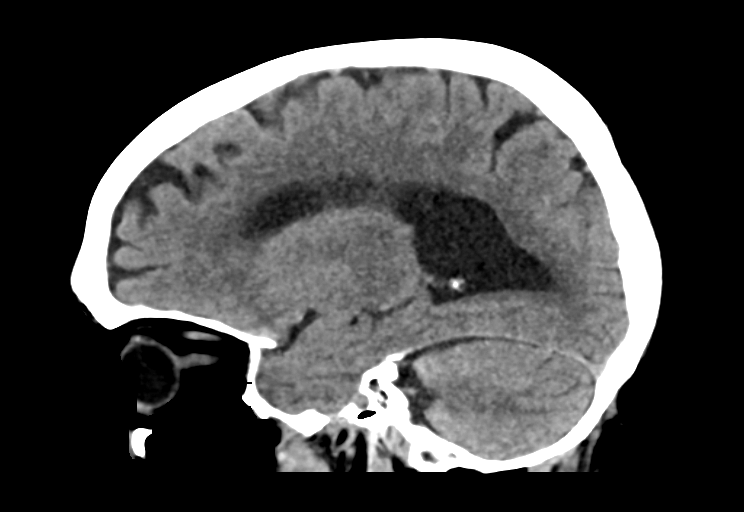

[16 of 47 positions shown; findings below may reference images not displayed]

FINDINGS: Brain: No evidence of acute infarction, hemorrhage, extra-axial
collection, ventriculomegaly, or mass effect. Generalized cerebral
atrophy. Periventricular white matter low attenuation likely
secondary to microangiopathy.

Vascular: Cerebrovascular atherosclerotic calcifications are noted.

Skull: Negative for fracture or focal lesion.

Sinuses/Orbits: Visualized portions of the orbits are unremarkable.
Visualized portions of the paranasal sinuses and mastoid air cells
are unremarkable.

Other: None.
IMPRESSION: 1. No acute intracranial pathology.
2. Chronic microvascular disease and cerebral atrophy.
